# Patient Record
Sex: Female | Born: 1990 | Race: Black or African American | Hispanic: No | Marital: Single | Smoking: Never smoker
Health system: Southern US, Community
[De-identification: ages and names within clinical notes are randomized; demographics above are authoritative.]

---

## 2017-12-15 DIAGNOSIS — B009 Herpesviral infection, unspecified: Secondary | ICD-10-CM

## 2017-12-15 HISTORY — DX: Herpesviral infection, unspecified: B00.9

## 2019-09-03 ENCOUNTER — Other Ambulatory Visit: Payer: Self-pay

## 2019-09-03 ENCOUNTER — Emergency Department (HOSPITAL_BASED_OUTPATIENT_CLINIC_OR_DEPARTMENT_OTHER): Payer: BC Managed Care – PPO

## 2019-09-03 ENCOUNTER — Emergency Department (HOSPITAL_BASED_OUTPATIENT_CLINIC_OR_DEPARTMENT_OTHER)
Admission: EM | Admit: 2019-09-03 | Discharge: 2019-09-03 | Disposition: A | Discharge status: Home / Self Care | Payer: BC Managed Care – PPO | Attending: Emergency Medicine | Admitting: Emergency Medicine

## 2019-09-03 ENCOUNTER — Encounter (HOSPITAL_BASED_OUTPATIENT_CLINIC_OR_DEPARTMENT_OTHER): Payer: Self-pay | Admitting: *Deleted

## 2019-09-03 DIAGNOSIS — R0789 Other chest pain: Secondary | ICD-10-CM | POA: Insufficient documentation

## 2019-09-03 LAB — CBC
HCT: 38.6 % (ref 36.0–46.0)
Hemoglobin: 12.5 g/dL (ref 12.0–15.0)
MCH: 31 pg (ref 26.0–34.0)
MCHC: 32.4 g/dL (ref 30.0–36.0)
MCV: 95.8 fL (ref 80.0–100.0)
Platelets: 230 10*3/uL (ref 150–400)
RBC: 4.03 MIL/uL (ref 3.87–5.11)
RDW: 12 % (ref 11.5–15.5)
WBC: 3.2 10*3/uL — ABNORMAL LOW (ref 4.0–10.5)
nRBC: 0 % (ref 0.0–0.2)

## 2019-09-03 LAB — BASIC METABOLIC PANEL
Anion gap: 7 (ref 5–15)
BUN: 11 mg/dL (ref 6–20)
CO2: 25 mmol/L (ref 22–32)
Calcium: 8.9 mg/dL (ref 8.9–10.3)
Chloride: 104 mmol/L (ref 98–111)
Creatinine, Ser: 0.78 mg/dL (ref 0.44–1.00)
GFR calc Af Amer: >60 mL/min (ref >60)
GFR calc non Af Amer: >60 mL/min (ref >60)
Glucose, Bld: 84 mg/dL (ref 70–99)
Potassium: 4.3 mmol/L (ref 3.5–5.1)
Sodium: 136 mmol/L (ref 135–145)

## 2019-09-03 LAB — TROPONIN I (HIGH SENSITIVITY): Troponin I (High Sensitivity): <2 ng/L (ref <18)

## 2019-09-03 NOTE — ED Triage Notes (Signed)
Chest for 3 days

## 2019-09-03 NOTE — Discharge Instructions (Signed)
Thank you for allowing me to care for you today. Please return to the emergency department if you have new or worsening symptoms.   

## 2019-09-03 NOTE — ED Provider Notes (Signed)
[facility]ENCY DEPARTMENT Provider Note   CSN: [id number] Arrival date & time: 09/03/19  1000     History Chief Complaint  Patient presents with  . Chest Pain    Maria Lam is a 29 y.o. female.  A 29 year old patient presents for evaluation of chest pain. She has a PMH of anxiety for which she takes celexa for. Initial onset of pain was more than 6 hours ago. The patient's chest pain is well-localized on the right side of her chest, is sharp and is not worse with exertion. It is worse with cough or deep breath. The patient's chest pain is not middle- or left-sided, is not described as heaviness/pressure/tightness and does not radiate to the arms/jaw/neck. The patient does not complain of nausea and denies diaphoresis. The patient has no history of stroke, has no history of peripheral artery disease, has not smoked in the past 90 days, denies any history of treated diabetes, has no relevant family history of coronary artery disease (first degree relative at less than age 87), is not hypertensive, has no history of hypercholesterolemia and does not have an elevated BMI (>=30).         Past Medical History:  Diagnosis Date  . Herpes 12/2017    There are no problems to display for this patient.   History reviewed. No pertinent surgical history.   OB History   No obstetric history on file.     History reviewed. No pertinent family history.  Social History   Tobacco Use  . Smoking status: Never Smoker  . Smokeless tobacco: Never Used  Substance Use Topics  . Alcohol use: Yes    Comment: occasionally  . Drug use: Never    Home Medications Prior to Admission medications   Medication Sig Start Date End Date Taking? Authorizing Provider  citalopram (CELEXA) 10 MG tablet Take 10 mg by mouth daily.   Yes [provider]  metroNIDAZOLE (FLAGYL) 500 MG tablet Take 500 mg by mouth 2 (two) times daily. 08/31/19 09/06/19 Yes [provider]    valACYclovir (VALTREX) 1000 MG tablet Take 1,000 mg by mouth daily.   Yes [provider]    Allergies    Patient has no known allergies.  Review of Systems   Review of Systems  Constitutional: Negative.   HENT: Negative.   Respiratory: Negative for cough, shortness of breath and wheezing.   Cardiovascular: Positive for chest pain. Negative for palpitations and leg swelling.  Gastrointestinal: Negative for nausea and vomiting.  Musculoskeletal: Negative for arthralgias and back pain.  Skin: Negative for rash and wound.  Allergic/Immunologic: Negative for immunocompromised state.  Neurological: Negative for dizziness, light-headedness and headaches.    Physical Exam Updated Vital Signs BP 107/69 (BP Location: Right Arm)   Pulse 72   Temp 98.4 F (36.9 C) (Oral)   Resp 14   Ht 5\' 6"  (1.676 m)   Wt 113.4 kg   LMP 08/27/2019   SpO2 99%   BMI 40.35 kg/m   Physical Exam Vitals and nursing note reviewed.  Constitutional:      General: She is not in acute distress.    Appearance: Normal appearance. She is well-developed. She is not ill-appearing, toxic-appearing or diaphoretic.  HENT:     Head: Normocephalic.  Eyes:     Conjunctiva/sclera: Conjunctivae normal.  Cardiovascular:     Heart sounds: Normal heart sounds.  Pulmonary:     Effort: Pulmonary effort is normal. No tachypnea or accessory muscle usage.  Breath sounds: Normal breath sounds.  Chest:     Chest wall: No mass or tenderness.     Comments: Patient did have reproduction of pain with deep breath Skin:    General: Skin is dry.  Neurological:     General: No focal deficit present.     Mental Status: She is alert.  Psychiatric:        Mood and Affect: Mood normal.     ED Results / Procedures / Treatments   Labs (all labs ordered are listed, but only abnormal results are displayed) Labs Reviewed  CBC - Abnormal; Notable for the following components:      Result Value   WBC 3.2 (*)    All  other components within normal limits  BASIC METABOLIC PANEL  TROPONIN I (HIGH SENSITIVITY)    EKG EKG Interpretation  Date/Time:  Wednesday September 03 2019 10:24:13 EST Ventricular Rate:  80 PR Interval:    QRS Duration: 87 QT Interval:  392 QTC Calculation: 453 R Axis:   46 Text Interpretation: Sinus rhythm Confirmed by Raeford Razor [phone]) on 09/03/2019 10:41:09 AM   Radiology DG Chest Port 1 View  Result Date: 09/03/2019 CLINICAL DATA:  Chest pain EXAM: PORTABLE CHEST 1 VIEW COMPARISON:  None. FINDINGS: Cardiac shadow is mildly accentuated by the frontal technique. The lungs are clear bilaterally. No bony abnormality is noted. IMPRESSION: No active disease. Electronically Signed   By: Alcide Clever M.D.   On: 09/03/2019 11:16    Procedures Procedures (including critical care time)  Medications Ordered in ED Medications - No data to display  ED Course  I have reviewed the triage vital signs and the nursing notes.  Pertinent labs & imaging results that were available during my care of the patient were reviewed by me and considered in my medical decision making (see chart for details).  Clinical Course as of Sep 02 1205  Wed Sep 03, 2019  1037 Patient with heart score of 0 presenting to the ER for chest pain on the right side for 2-3 days, worse with coughing and breathing. Negative workup including trop, chest xray, EKG. Possibly anxiety vs musculoskeletal. Normal vitals. Advised on return precautions.    [KM]    Clinical Course User Index [KM] Jeral Pinch   MDM Rules/Calculators/A&P                      Based on review of vitals, medical screening exam, lab work and/or imaging, there does not appear to be an acute, emergent etiology for the patient's symptoms. Counseled pt on good return precautions and encouraged both PCP and ED follow-up as needed.  Prior to discharge, I also discussed incidental imaging findings with patient in detail and advised  appropriate, recommended follow-up in detail.  Clinical Impression: 1. Atypical chest pain     Disposition: Discharge  Prior to providing a prescription for a controlled substance, I independently reviewed the patient's recent prescription history on the [place] Controlled Substance Reporting System. The patient had no recent or regular prescriptions and was deemed appropriate for a brief, less than 3 day prescription of narcotic for acute analgesia.  This note was prepared with assistance of Conservation officer, historic buildings. Occasional wrong-word or sound-a-like substitutions may have occurred due to the inherent limitations of voice recognition software.  Final Clinical Impression(s) / ED Diagnoses Final diagnoses:  Atypical chest pain    Rx / DC Orders ED Discharge Orders    None  Jeral Pinch 09/03/19 1207    Raeford Razor, MD 09/04/19 1116

## 2020-10-20 IMAGING — DX DG CHEST 1V PORT
1 series · 1 of 1 positions shown · non-contrast
Comparison: None.

CLINICAL DATA: Chest pain

EXAM:
PORTABLE CHEST 1 VIEW

[chest ap]
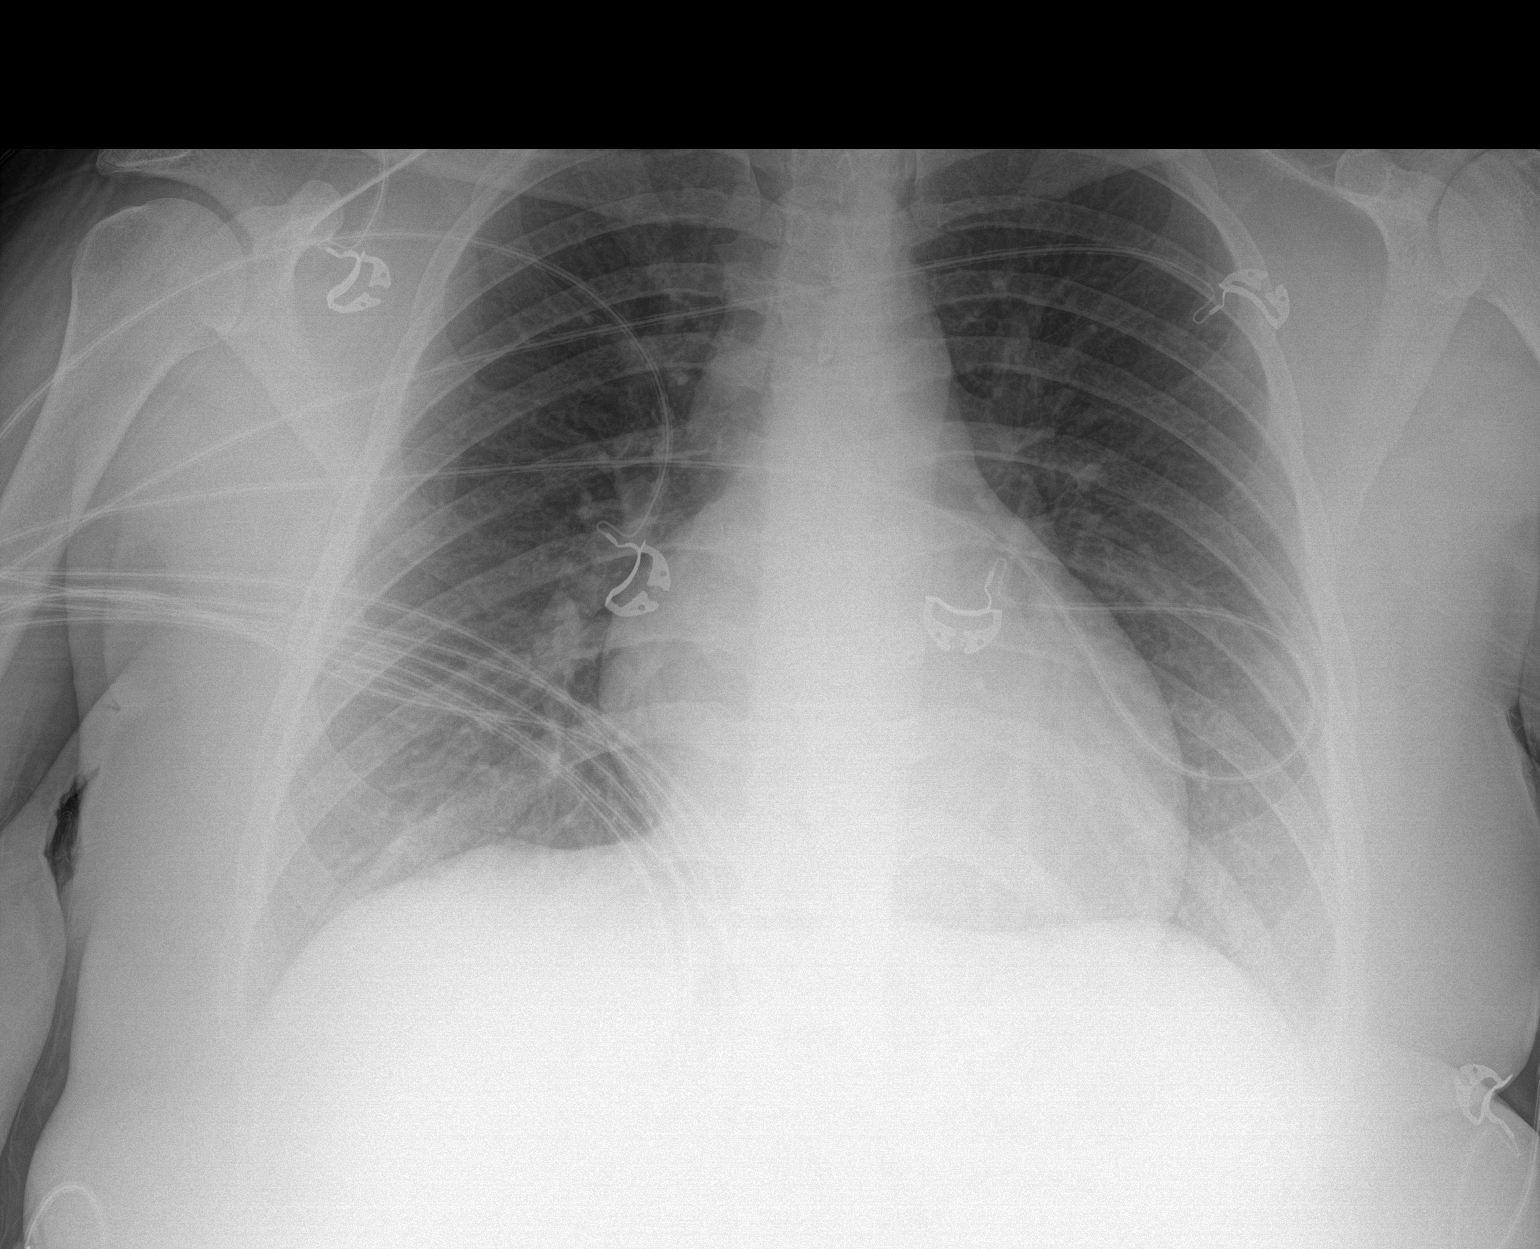

[1 of 1 positions shown; findings below may reference images not displayed]

FINDINGS: Cardiac shadow is mildly accentuated by the frontal technique. The
lungs are clear bilaterally. No bony abnormality is noted.
IMPRESSION: No active disease.
